# Patient Record
Sex: Male | Born: 1990 | Race: Black or African American | Hispanic: No | Marital: Single | Smoking: Never smoker
Health system: Southern US, Community
[De-identification: ages and names within clinical notes are randomized; demographics above are authoritative.]

## PROBLEM LIST (undated history)

## (undated) DIAGNOSIS — G43909 Migraine, unspecified, not intractable, without status migrainosus: Secondary | ICD-10-CM

## (undated) DIAGNOSIS — Z8719 Personal history of other diseases of the digestive system: Secondary | ICD-10-CM

## (undated) DIAGNOSIS — F419 Anxiety disorder, unspecified: Secondary | ICD-10-CM

## (undated) HISTORY — PX: OTHER SURGICAL HISTORY: SHX169

## (undated) HISTORY — PX: ROTATOR CUFF REPAIR: SHX139

## (undated) HISTORY — PX: FRACTURE SURGERY: SHX138

---

## 2011-01-31 ENCOUNTER — Emergency Department (HOSPITAL_BASED_OUTPATIENT_CLINIC_OR_DEPARTMENT_OTHER)
Admission: EM | Admit: 2011-01-31 | Discharge: 2011-02-01 | Disposition: A | Discharge status: Home / Self Care | Payer: Managed Care, Other (non HMO) | Attending: Emergency Medicine | Admitting: Emergency Medicine

## 2011-01-31 ENCOUNTER — Encounter: Payer: Self-pay | Admitting: *Deleted

## 2011-01-31 DIAGNOSIS — F411 Generalized anxiety disorder: Secondary | ICD-10-CM | POA: Insufficient documentation

## 2011-01-31 DIAGNOSIS — F17200 Nicotine dependence, unspecified, uncomplicated: Secondary | ICD-10-CM | POA: Insufficient documentation

## 2011-01-31 DIAGNOSIS — R0789 Other chest pain: Secondary | ICD-10-CM

## 2011-01-31 DIAGNOSIS — R071 Chest pain on breathing: Secondary | ICD-10-CM | POA: Insufficient documentation

## 2011-01-31 LAB — URINALYSIS, ROUTINE W REFLEX MICROSCOPIC
Hgb urine dipstick: NEGATIVE
Nitrite: NEGATIVE
Specific Gravity, Urine: 1.016 (ref 1.005–1.030)
Urobilinogen, UA: 0.2 mg/dL (ref 0.0–1.0)

## 2011-01-31 NOTE — ED Notes (Addendum)
Per EMS pt went to [facility] to be seen for left upper abd pain but decided to leave before being seen d/t feeling better. pt now c/o left rib area pain that worsens with deep breath and anxiety.

## 2011-01-31 NOTE — ED Notes (Signed)
C/o pain to left rib area which worsened after he became anxious and SOB.

## 2011-02-01 ENCOUNTER — Encounter (HOSPITAL_BASED_OUTPATIENT_CLINIC_OR_DEPARTMENT_OTHER): Payer: Self-pay | Admitting: Emergency Medicine

## 2011-02-01 ENCOUNTER — Emergency Department (INDEPENDENT_AMBULATORY_CARE_PROVIDER_SITE_OTHER): Payer: Managed Care, Other (non HMO)

## 2011-02-01 DIAGNOSIS — R079 Chest pain, unspecified: Secondary | ICD-10-CM

## 2011-02-01 NOTE — ED Provider Notes (Signed)
History     chief complaint chest pain Chief Complaint  Patient presents with  . Flank Pain   HPI Patient developed left lateral chest pain 9:30 PM yesterday has improved with time worse with moving changing position or twisting torso no shortness of breath nausea or sweatiness has had similar pains in the past multiple times with anxiety attacks feels he had anxiety tonight when he could not find his direction while driving or treatment prior to coming here History reviewed. No pertinent past medical history. past medical history panic attacks  Past Surgical History  Procedure Date  . Fracture surgery     History reviewed. No pertinent family history.  History  Substance Use Topics  . Smoking status: Passive Smoker  . Smokeless tobacco: Not on file  . Alcohol Use: No      Review of Systems  Constitutional: Negative.   HENT: Negative.   Respiratory: Negative.  Negative for shortness of breath.   Cardiovascular: Positive for chest pain.  Gastrointestinal: Negative.   Musculoskeletal: Negative.   Skin: Negative.   Neurological: Negative.   Hematological: Negative.   Psychiatric/Behavioral: Negative.        Anxiety    Physical Exam  BP 124/57  Pulse 64  Temp(Src) 98.2 F (36.8 C) (Oral)  Resp 12  SpO2 99%  Physical Exam  Nursing note and vitals reviewed. Constitutional: He appears well-developed and well-nourished.  HENT:  Head: Normocephalic and atraumatic.  Eyes: Conjunctivae are normal. Pupils are equal, round, and reactive to light.  Neck: Neck supple. No tracheal deviation present. No thyromegaly present.  Cardiovascular: Normal rate and regular rhythm.   No murmur heard. Pulmonary/Chest: Effort normal and breath sounds normal. He exhibits no tenderness.       Left lateral chest wall tender. Pain reproduced easily by forcible abduction of left shoulder  Abdominal: Soft. Bowel sounds are normal. He exhibits no distension. There is no tenderness.    Musculoskeletal: Normal range of motion. He exhibits no edema and no tenderness.  Neurological: He is alert. Coordination normal.  Skin: Skin is warm and dry. No rash noted.  Psychiatric: He has a normal mood and affect.    ED Course  Procedures  MDM Chest x-ray no acute disease reviewed by me. Assessment: #1 chest wall pain #2 panic attack      Doug Sou, MD 02/01/11 0981

## 2011-02-07 ENCOUNTER — Encounter (HOSPITAL_BASED_OUTPATIENT_CLINIC_OR_DEPARTMENT_OTHER): Payer: Self-pay

## 2011-02-07 ENCOUNTER — Other Ambulatory Visit: Payer: Self-pay

## 2011-02-07 ENCOUNTER — Emergency Department (HOSPITAL_BASED_OUTPATIENT_CLINIC_OR_DEPARTMENT_OTHER)
Admission: EM | Admit: 2011-02-07 | Discharge: 2011-02-07 | Disposition: A | Discharge status: Home / Self Care | Payer: Managed Care, Other (non HMO) | Attending: Emergency Medicine | Admitting: Emergency Medicine

## 2011-02-07 ENCOUNTER — Emergency Department (INDEPENDENT_AMBULATORY_CARE_PROVIDER_SITE_OTHER): Payer: Managed Care, Other (non HMO)

## 2011-02-07 DIAGNOSIS — R51 Headache: Secondary | ICD-10-CM

## 2011-02-07 DIAGNOSIS — R55 Syncope and collapse: Secondary | ICD-10-CM

## 2011-02-07 DIAGNOSIS — F411 Generalized anxiety disorder: Secondary | ICD-10-CM | POA: Insufficient documentation

## 2011-02-07 HISTORY — DX: Anxiety disorder, unspecified: F41.9

## 2011-02-07 LAB — BASIC METABOLIC PANEL
GFR calc Af Amer: >60 mL/min (ref >60)
GFR calc non Af Amer: >60 mL/min (ref >60)
Glucose, Bld: 93 mg/dL (ref 70–99)
Potassium: 4.3 mEq/L (ref 3.5–5.1)
Sodium: 139 mEq/L (ref 135–145)

## 2011-02-07 MED ORDER — METOCLOPRAMIDE HCL 5 MG/ML IJ SOLN
10.0000 mg | Freq: Once | INTRAMUSCULAR | Status: AC
  Administered 2011-02-07: 10 mg via INTRAMUSCULAR
  Filled 2011-02-07: qty 2

## 2011-02-07 MED ORDER — KETOROLAC TROMETHAMINE 60 MG/2ML IM SOLN
60.0000 mg | Freq: Once | INTRAMUSCULAR | Status: AC
  Administered 2011-02-07: 60 mg via INTRAMUSCULAR
  Filled 2011-02-07: qty 2

## 2011-02-07 NOTE — ED Provider Notes (Signed)
Medical screening examination/treatment/procedure(s) were performed by non-physician practitioner and as supervising physician I was immediately available for consultation/collaboration.   Date: 02/07/2011  Rate: 71  Rhythm: normal sinus rhythm  QRS Axis: normal  Intervals: normal  ST/T Wave abnormalities: early repolarization  Conduction Disutrbances:none  Narrative Interpretation: normal other than probable early repolarization  Old EKG Reviewed: none available    Celene Kras, MD 02/07/11 1444

## 2011-02-07 NOTE — ED Notes (Signed)
Pt reports he was standing at work when he developed a "hot" sensation and recalls waking up on the floor shaking.  States he's had a headache x 4 weeks unrelieved after taking OTC meds.

## 2011-02-07 NOTE — ED Notes (Signed)
Pt recalls being at work, becoming "hot" and then recalls awakening on the floor shaking.  EMS reports no syncopal episode and pt was CAOx4 at time of arrival.  He reports a headache and generalized body aches.  He has been seen recently for similar symptoms and he reports being stressed.

## 2011-02-07 NOTE — ED Notes (Signed)
Pt ambulatory to restroom with his father pt is ambulatory with a steady gait is awake alert and oriented complains of  "soreness all over"

## 2011-02-07 NOTE — ED Notes (Signed)
States he was seen in [place] for headache and had a "normal" CT head scan.

## 2011-03-04 ENCOUNTER — Emergency Department (HOSPITAL_COMMUNITY)
Admission: EM | Admit: 2011-03-04 | Discharge: 2011-03-04 | Disposition: A | Discharge status: Home / Self Care | Payer: Managed Care, Other (non HMO) | Attending: Emergency Medicine | Admitting: Emergency Medicine

## 2011-03-04 DIAGNOSIS — IMO0002 Reserved for concepts with insufficient information to code with codable children: Secondary | ICD-10-CM | POA: Insufficient documentation

## 2011-03-04 DIAGNOSIS — IMO0001 Reserved for inherently not codable concepts without codable children: Secondary | ICD-10-CM | POA: Insufficient documentation

## 2011-03-04 DIAGNOSIS — F411 Generalized anxiety disorder: Secondary | ICD-10-CM | POA: Insufficient documentation

## 2011-03-04 DIAGNOSIS — R071 Chest pain on breathing: Secondary | ICD-10-CM | POA: Insufficient documentation

## 2011-03-04 DIAGNOSIS — M2550 Pain in unspecified joint: Secondary | ICD-10-CM | POA: Insufficient documentation

## 2011-03-04 DIAGNOSIS — R209 Unspecified disturbances of skin sensation: Secondary | ICD-10-CM | POA: Insufficient documentation

## 2011-03-04 DIAGNOSIS — R070 Pain in throat: Secondary | ICD-10-CM | POA: Insufficient documentation

## 2011-03-04 DIAGNOSIS — J3489 Other specified disorders of nose and nasal sinuses: Secondary | ICD-10-CM | POA: Insufficient documentation

## 2012-01-13 ENCOUNTER — Encounter (HOSPITAL_COMMUNITY): Payer: Self-pay | Admitting: Emergency Medicine

## 2012-01-13 ENCOUNTER — Emergency Department (HOSPITAL_COMMUNITY)
Admission: EM | Admit: 2012-01-13 | Discharge: 2012-01-13 | Disposition: A | Discharge status: Home / Self Care | Payer: Managed Care, Other (non HMO) | Source: Home / Self Care | Attending: Emergency Medicine | Admitting: Emergency Medicine

## 2012-01-13 DIAGNOSIS — L02811 Cutaneous abscess of head [any part, except face]: Secondary | ICD-10-CM

## 2012-01-13 DIAGNOSIS — L02818 Cutaneous abscess of other sites: Secondary | ICD-10-CM

## 2012-01-13 DIAGNOSIS — R51 Headache: Secondary | ICD-10-CM

## 2012-01-13 HISTORY — DX: Personal history of other diseases of the digestive system: Z87.19

## 2012-01-13 MED ORDER — NAPROXEN 500 MG PO TABS: 500.0000 mg | ORAL_TABLET | Freq: Two times a day (BID) | ORAL | Status: AC | PRN

## 2012-01-13 MED ORDER — SULFAMETHOXAZOLE-TRIMETHOPRIM 800-160 MG PO TABS: 1.0000 | ORAL_TABLET | Freq: Two times a day (BID) | ORAL | Status: AC

## 2012-01-13 NOTE — Discharge Instructions (Signed)
Follow up with your regular doctor in the next week.  Keep track of your headaches and other symptoms that happen with your headaches and discuss this with your doctor.  Use warm compresses on your scalp infection at least 3 times a day.  Finish all of the antibiotics (trimethoprim-sulfamethoxazole) even if you are feeling better.    Abscess An abscess (boil or furuncle) is an infected area under your skin. This area is filled with yellowish white fluid (pus). HOME CARE   Only take medicine as told by your doctor.   Keep the skin clean around your abscess. Keep clothes that may touch the abscess clean.   Avoid direct skin contact with other people. The infection can spread by skin contact with others.   Practice good hygiene and do not share personal care items.   Do not share athletic equipment, towels, or whirlpools. Shower after every practice or work out session.   See your doctor for a follow-up visit as told.  GET HELP RIGHT AWAY IF:   There is more pain, puffiness (swelling), and redness in the wound site.   There is fluid or bleeding from the wound site.   You have muscle aches, chills, fever, or feel sick.   You or your child has a temperature by mouth above 102 F (38.9 C), not controlled by medicine.   Your baby is older than 3 months with a rectal temperature of 102 F (38.9 C) or higher.  MAKE SURE YOU:   Understand these instructions.   Will watch your condition.   Will get help right away if you are not doing well or get worse.  Document Released: 12/19/2007 Document Revised: 06/21/2011 Document Reviewed: 12/19/2007 [facility] Patient Information 2012 [place], [place].  General Headache, Without Cause A general headache has no specific cause. These headaches are not life-threatening. They will not lead to other types of headaches. HOME CARE   Make and keep follow-up visits with your doctor.   Only take medicine as told by your doctor.   Try to relax, get a  massage, or use your thoughts to control your body (biofeedback).   Apply cold or heat to the head and neck. Apply 3 or 4 times a day or as needed.  Finding out the results of your test Ask when your test results will be ready. Make sure you get your test results. GET HELP RIGHT AWAY IF:   You have problems with medicine.   Your medicine does not help relieve pain.   Your headache changes or becomes worse.   You feel sick to your stomach (nauseous) or throw up (vomit).   You have a temperature by mouth above 102 F (38.9 C), not controlled by medicine.   Your have a stiff neck.   You have vision loss.   You have muscle weakness.   You lose control of your muscles.   You lose balance or have trouble walking.   You feel like you are going to pass out (faint).  MAKE SURE YOU:   Understand these instructions.   Will watch this condition.   Will get help right away if you are not doing well or get worse.  Document Released: 04/10/2008 Document Revised: 06/21/2011 Document Reviewed: 04/10/2008 [facility] Patient Information 2012 [place], [place].

## 2012-01-13 NOTE — ED Provider Notes (Signed)
History     CSN: [id number]  Arrival date & time 01/13/12  1445   First MD Initiated Contact with Patient 01/13/12 1752      Chief Complaint  Patient presents with  . Headache    (Consider location/radiation/quality/duration/timing/severity/associated sxs/prior treatment) HPI Comments: Pt developed headache a month ago.  Sometimes in B forehead, sometimes in back of head.  Sometimes severe, sometimes mild, but never goes away.  Has never had headache like this before.  Has been seen x2 at ER in [facility], told probably had migraines.  Sometimes sounds bother him. A week ago headaches became worse at the top of his head, area very sore, feels "numb and tingly", losing hair in this area.  Had staph infections at base of scalp in 2009 that required I&D and antibiotics.  None since.  No drainage from area at top of head.  Vomited x1 3 days ago, none since but did feel nauseated 2 days ago, none since.  Denies head injury/concussion.  Does play tackle football but is very emphatic that he has never been hit in the head.    Patient is a 21 y.o. male presenting with headaches. The history is provided by the patient.  Headache The primary symptoms include headaches, dizziness, nausea and vomiting. Primary symptoms do not include syncope, loss of consciousness, altered mental status, visual change, focal weakness, loss of sensation, speech change or fever. Episode onset: a month ago. Episode duration: one month. The symptoms are waxing and waning.  The headache is not associated with photophobia, visual change, neck stiffness, weakness or loss of balance.  Dizziness also occurs with nausea and vomiting. Dizziness does not occur with weakness.  Nausea began 2 days ago (had nausea 2 days ago, none since).  Onset: 3 days ago. Vomiting occurred once. The emesis contains stomach contents.  Additional symptoms do not include neck stiffness, weakness, loss of balance or photophobia.    Past Medical  History  Diagnosis Date  . Anxiety   . H/O gastroesophageal reflux (GERD)     Past Surgical History  Procedure Date  . Fracture surgery   . Left shoulder   . Rotator cuff repair     History reviewed. No pertinent family history.  History  Substance Use Topics  . Smoking status: Never Smoker   . Smokeless tobacco: Not on file  . Alcohol Use: No      Review of Systems  Constitutional: Positive for fatigue. Negative for fever and chills.  HENT: Negative for ear pain, congestion, sore throat, rhinorrhea, neck pain, neck stiffness, postnasal drip and sinus pressure.   Eyes: Negative for photophobia and visual disturbance.  Respiratory: Negative for cough and shortness of breath.   Cardiovascular: Negative for chest pain, palpitations and syncope.  Gastrointestinal: Positive for nausea and vomiting. Negative for abdominal pain.  Skin: Positive for rash.       Spot on top of head is site of worst "headache", hair loss in this area.   Neurological: Positive for dizziness, numbness and headaches. Negative for speech change, focal weakness, loss of consciousness, syncope, facial asymmetry, speech difficulty, weakness and loss of balance.  Psychiatric/Behavioral: Negative for altered mental status.    Allergies  Apple juice and Prednisone  Home Medications   Current Outpatient Rx  Name Route Sig Dispense Refill  . OMEPRAZOLE 20 MG PO CPDR Oral Take 20 mg by mouth daily.    Marland Kitchen BISMUTH SUBSALICYLATE 262 MG PO CHEW Oral Chew 524 mg by mouth as  needed. indigestion     . BISMUTH SUBSALICYLATE 262 MG/15ML PO SUSP Oral Take 15 mLs by mouth as needed. indigestion     . CALCIUM CARBONATE ANTACID 500 MG PO CHEW Oral Chew 6 tablets by mouth as needed. indigestion     . IBUPROFEN 200 MG PO TABS Oral Take 400 mg by mouth as needed. headache     . NAPROXEN 500 MG PO TABS Oral Take 1 tablet (500 mg total) by mouth 2 (two) times daily as needed (for pain). 30 tablet 0  .  SULFAMETHOXAZOLE-TRIMETHOPRIM 800-160 MG PO TABS Oral Take 1 tablet by mouth every 12 (twelve) hours. 20 tablet 0    BP 135/72  Pulse 58  Temp 98.7 F (37.1 C) (Oral)  Resp 15  SpO2 100%  Physical Exam  Constitutional: He is oriented to person, place, and time. He appears well-developed and well-nourished. He is cooperative. He does not appear ill. No distress.  HENT:  Right Ear: Tympanic membrane, external ear and ear canal normal.  Left Ear: Tympanic membrane, external ear and ear canal normal.  Nose: No mucosal edema.  Mouth/Throat: Oropharynx is clear and moist.  Eyes: Conjunctivae and EOM are normal. Pupils are equal, round, and reactive to light.  Neck: Normal range of motion. Neck supple. No spinous process tenderness and no muscular tenderness present. No rigidity. No edema present.  Cardiovascular: Normal rate and regular rhythm.   Pulmonary/Chest: Effort normal and breath sounds normal.  Lymphadenopathy:       Head (right side): No submental, no submandibular, no tonsillar, no preauricular, no posterior auricular and no occipital adenopathy present.       Head (left side): No submental, no submandibular, no tonsillar, no preauricular, no posterior auricular and no occipital adenopathy present.    He has no cervical adenopathy.  Neurological: He is alert and oriented to person, place, and time. He has normal strength. He displays a negative Romberg sign. Coordination and gait normal.       CN II-IX intact  Skin: Skin is warm and dry. Rash noted.       1.5cm diameter area of hair loss at top of head, area very tender to palp, slight bogginess to tissue, but no distinct area of fluctuance.  Palpation in this area makes "headache much worse".  Central in this area is a scab/formerly open area. No pus draining noted at this time.  Pseudofolliculitis at base of scalp/hairline at back of neck.      ED Course  Procedures (including critical care time)  Labs Reviewed - No data to  display No results found.   1. Headache   2. Abscess, scalp       MDM  N/V, phonophobia c/w migraine headaches.  Worsening headache in last week or so likely related to skin infection/abscess on top of head.  I don't believe I&D would be helpful with this time. Will tx as abscess and have pt f/u with pcp.  It's quite possible pt has had infection developing in this area for some time that caused month-long headache.  Discussed possibility of migraines with pt, pt to discuss headache sx with pcp.          Cathlyn Parsons, NP 01/13/12 1816

## 2012-01-13 NOTE — ED Notes (Signed)
Multiple complaints.  C/o headaches for one month and seen at [facility]  for headaches and lightheadedness.  Told probably migraines.  C/o bump at top of scalp and now has hair loss.  .feels like something in throat

## 2013-08-30 ENCOUNTER — Encounter (HOSPITAL_COMMUNITY): Payer: Self-pay | Admitting: Emergency Medicine

## 2013-08-30 ENCOUNTER — Emergency Department (HOSPITAL_COMMUNITY)
Admission: EM | Admit: 2013-08-30 | Discharge: 2013-08-30 | Disposition: A | Discharge status: Home / Self Care | Payer: Managed Care, Other (non HMO) | Attending: Emergency Medicine | Admitting: Emergency Medicine

## 2013-08-30 DIAGNOSIS — Z8659 Personal history of other mental and behavioral disorders: Secondary | ICD-10-CM | POA: Insufficient documentation

## 2013-08-30 DIAGNOSIS — K219 Gastro-esophageal reflux disease without esophagitis: Secondary | ICD-10-CM | POA: Insufficient documentation

## 2013-08-30 DIAGNOSIS — R112 Nausea with vomiting, unspecified: Secondary | ICD-10-CM | POA: Insufficient documentation

## 2013-08-30 DIAGNOSIS — R61 Generalized hyperhidrosis: Secondary | ICD-10-CM | POA: Insufficient documentation

## 2013-08-30 DIAGNOSIS — R000 Tachycardia, unspecified: Secondary | ICD-10-CM | POA: Insufficient documentation

## 2013-08-30 DIAGNOSIS — J111 Influenza due to unidentified influenza virus with other respiratory manifestations: Secondary | ICD-10-CM | POA: Insufficient documentation

## 2013-08-30 DIAGNOSIS — Z79899 Other long term (current) drug therapy: Secondary | ICD-10-CM | POA: Insufficient documentation

## 2013-08-30 MED ORDER — ONDANSETRON HCL 4 MG PO TABS: 4.0000 mg | ORAL_TABLET | Freq: Four times a day (QID) | ORAL | Status: DC

## 2013-08-30 MED ORDER — IBUPROFEN 800 MG PO TABS: 800.0000 mg | ORAL_TABLET | Freq: Three times a day (TID) | ORAL | Status: DC

## 2013-08-30 NOTE — ED Notes (Signed)
Pt states hes had flu like symptoms, body aches, and felt like he might have a fever since last night

## 2013-08-30 NOTE — Discharge Instructions (Signed)

## 2013-08-30 NOTE — ED Provider Notes (Signed)
CSN: [id number]     Arrival date & time 08/30/13  1143 History  This chart was scribed for Elpidio Anis, non-physician practitioner, working with Rolland Porter, MD, by Ellin Mayhew, ED Scribe. This patient was seen in room TR07C/TR07C and the patient's care was started at 12:17 PM.  Chief Complaint  Patient presents with  . Fever   The history is provided by the patient. No language interpreter was used.   HPI Comments: Laramie Meissner is a 23 y.o. male with a history of gastritis, who presents to the Emergency Department complaining of new flu-like symptoms, including, myalgias, chills, HA, and fever (temperature taken subjectively at home) with onset one day. Additionally, patient reports having associated nausea with 2 episodes of vomiting. He denies the vomiting to be due to coughing. This is a new problem for the patient and he states his symptoms have been constant and non changing. He reports that he has been able to swallow; however, with pain due to swollen lymph nodes. Patient states he has been eating less than usual in order to avoid vomiting. Patient denies any recent sick contacts. He denies trying to take any OTC medication. He denies taking medication for any other conditions.   Past Medical History  Diagnosis Date  . Anxiety   . H/O gastroesophageal reflux (GERD)    Past Surgical History  Procedure Laterality Date  . Fracture surgery    . Left shoulder    . Rotator cuff repair     History reviewed. No pertinent family history. History  Substance Use Topics  . Smoking status: Never Smoker   . Smokeless tobacco: Not on file  . Alcohol Use: No    Review of Systems  Constitutional: Positive for fever, chills, diaphoresis and appetite change.  HENT: Positive for congestion, sinus pressure and sore throat.   Respiratory: Negative for cough and shortness of breath.   Gastrointestinal: Positive for nausea and vomiting. Negative for diarrhea.  Musculoskeletal: Positive for  myalgias.  Neurological: Negative for weakness.  All other systems reviewed and are negative.   Allergies  Apple juice and Prednisone  Home Medications   Current Outpatient Rx  Name  Route  Sig  Dispense  Refill  . bismuth subsalicylate (PEPTO BISMOL) 262 MG chewable tablet   Oral   Chew 524 mg by mouth as needed. indigestion          . bismuth subsalicylate (PEPTO BISMOL) 262 MG/15ML suspension   Oral   Take 15 mLs by mouth as needed. indigestion          . calcium carbonate (TUMS - DOSED IN MG ELEMENTAL CALCIUM) 500 MG chewable tablet   Oral   Chew 6 tablets by mouth as needed. indigestion          . ibuprofen (ADVIL,MOTRIN) 200 MG tablet   Oral   Take 400 mg by mouth as needed. headache          . omeprazole (PRILOSEC) 20 MG capsule   Oral   Take 20 mg by mouth daily.          Triage Vitals: BP 132/95  Pulse 89  Temp(Src) 99.1 F (37.3 C) (Oral)  Resp 18  SpO2 100%  Physical Exam  Nursing note and vitals reviewed. Constitutional: He is oriented to person, place, and time. He appears well-developed and well-nourished. No distress.  Ill appearing, non-toxic.   HENT:  Head: Normocephalic and atraumatic.  Mouth/Throat: Uvula is midline. Posterior oropharyngeal erythema (mild) present. No oropharyngeal  exudate.  Eyes: EOM are normal.  Neck: Neck supple. No tracheal deviation present.  Cardiovascular: Tachycardia present.   No murmur heard. Borderline tachycardia noted.  Pulmonary/Chest: Effort normal and breath sounds normal. No respiratory distress. He has no wheezes.  Abdominal: There is no tenderness.  Musculoskeletal: Normal range of motion.  Neurological: He is alert and oriented to person, place, and time.  Skin: Skin is warm and dry.  Psychiatric: He has a normal mood and affect. His behavior is normal.    ED Course  Procedures (including critical care time)  DIAGNOSTIC STUDIES: Oxygen Saturation is 100% on room air, normal by my  interpretation.    COORDINATION OF CARE: 12:20 PM-Discussed my suspicion of this being a case of influenza. Explained that the symptoms of influenza this year have been lasting longer than usual. Encouraged patient to drink many fluids, rest and to take tylenol and ibuprofen. Will prescribe medication for nausea. Patient ready to be discharged.Treatment plan discussed with patient and patient agrees.  Labs Review Labs Reviewed - No data to display Imaging Review No results found.  EKG Interpretation   None      MDM   Final diagnoses:  None   1. Influenza  He is non-toxic in appearance. Afebrile constellation of symptoms that are likely influenza in stable patient.   I personally performed the services described in this documentation, which was scribed in my presence. The recorded information has been reviewed and is accurate.     Arnoldo HookerShari A Morrison Paone, PA-C 08/30/13 1407

## 2013-09-03 NOTE — ED Provider Notes (Signed)
Medical screening examination/treatment/procedure(s) were performed by non-physician practitioner and as supervising physician I was immediately available for consultation/collaboration.  EKG Interpretation   None         Renate Danh, MD 09/03/13 0754 

## 2013-09-28 ENCOUNTER — Emergency Department (HOSPITAL_COMMUNITY)
Admission: EM | Admit: 2013-09-28 | Discharge: 2013-09-28 | Discharge status: Against Medical Advice | Payer: Managed Care, Other (non HMO) | Attending: Emergency Medicine | Admitting: Emergency Medicine

## 2013-09-28 ENCOUNTER — Encounter (HOSPITAL_COMMUNITY): Payer: Self-pay | Admitting: Emergency Medicine

## 2013-09-28 DIAGNOSIS — G43909 Migraine, unspecified, not intractable, without status migrainosus: Secondary | ICD-10-CM | POA: Insufficient documentation

## 2013-09-28 HISTORY — DX: Migraine, unspecified, not intractable, without status migrainosus: G43.909

## 2013-09-28 NOTE — ED Notes (Signed)
Pt updated on wait time. Pt reports he does not want to wait. Explained to pt he will have to check back in if he decides to be seen. Pt verbalized understanding.

## 2013-09-28 NOTE — ED Notes (Signed)
Pt reports migraine last night with light sensitivity. Took ibuprofen, pain went away. Today migraine returned, light sensitivity to right eye. States felt like his right eye went black. Is wearing sunglasses, c/o right eye pain and eye pressure. Pupils equal and reactive. Reports now migraine is minor. Using cell phone, in no distress.

## 2013-11-07 ENCOUNTER — Ambulatory Visit: Payer: Managed Care, Other (non HMO)

## 2013-11-07 ENCOUNTER — Ambulatory Visit (INDEPENDENT_AMBULATORY_CARE_PROVIDER_SITE_OTHER): Payer: Managed Care, Other (non HMO) | Admitting: Family Medicine

## 2013-11-07 VITALS — BP 120/64 | HR 78 | Temp 98.1°F | Resp 16 | Ht 72.0 in | Wt 215.0 lb

## 2013-11-07 DIAGNOSIS — M79645 Pain in left finger(s): Secondary | ICD-10-CM

## 2013-11-07 DIAGNOSIS — S6010XA Contusion of unspecified finger with damage to nail, initial encounter: Secondary | ICD-10-CM

## 2013-11-07 DIAGNOSIS — S60029A Contusion of unspecified index finger without damage to nail, initial encounter: Secondary | ICD-10-CM

## 2013-11-07 DIAGNOSIS — S6000XA Contusion of unspecified finger without damage to nail, initial encounter: Secondary | ICD-10-CM

## 2013-11-07 DIAGNOSIS — M79609 Pain in unspecified limb: Secondary | ICD-10-CM

## 2013-11-07 NOTE — Progress Notes (Addendum)
Subjective:    Patient ID: Wayne Davies, male    DOB: 05/04/1991, 23 y.o.   MRN: [id number]  HPI Chief Complaint  Patient presents with  . Nail Problem    1 day, bruise under nail    This chart was scribed for Meredith StaggersJeffrey Taje Littler, MD by Andrew Auaven Small, ED Scribe. This patient was seen in room 5 and the patient's care was started at 4:17 PM.  HPI Comment: Wayne Davies is a 23 y.o. male who presents to the [facility] complaining of a right 2nd digit injury onset 1 day around 4pm. Pt reports while at work for [employer],  lifting a palate, his finger got smushed between a machine and the palate. Pt compares the injury to slamming a finger into car door. Pt reports that he works for [employer] and that this is a new job. Pt reports an hour after the incident he iced finger and took aspirin before bed for pain. He reports that when he woke up this morning he iced his finger again. Pt describes the pain in finger as throbbing, sore and reports edema. Pt denies breaking or injuring this finger in the past but has had similar finger injuries to other fingers.  Pt is left hand dominant.   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  Past Medical History  Diagnosis Date  . Anxiety   . H/O gastroesophageal reflux (GERD)   . Migraine    Allergies  Allergen Reactions  . Apple Juice Other (See Comments)    Acts like a laxative   . Prednisone Other (See Comments)    unknown   Prior to Admission medications   Medication Sig Start Date End Date Taking? Authorizing Provider  ibuprofen (ADVIL,MOTRIN) 200 MG tablet Take 400 mg by mouth as needed. headache     Historical Provider, MD  ibuprofen (ADVIL,MOTRIN) 800 MG tablet Take 1 tablet (800 mg total) by mouth 3 (three) times daily. 08/30/13   Arnoldo HookerShari A Upstill, PA-C   Review of Systems  Musculoskeletal: Positive for myalgias.  Skin: Negative for wound.     . Objective:   Physical Exam  Nursing note and vitals  reviewed. Constitutional: He is oriented to person, place, and time. He appears well-developed and well-nourished. No distress.  HENT:  Head: Normocephalic and atraumatic.  Eyes: EOM are normal.  Neck: Normal range of motion. Neck supple.  Cardiovascular: Normal rate.   Pulmonary/Chest: Effort normal. No respiratory distress.  Musculoskeletal: Normal range of motion.  Left 2nd distal phalanx some soft tissue swelling but skin intact  no swelling or bruising of pad. subungual hematoma 40% at base of nail. Tender at distal phalynx just proximal to base of nail Able to resist flexion and extension at DIP  Neurological: He is alert and oriented to person, place, and time.  Skin: Skin is warm and dry.  Psychiatric: He has a normal mood and affect. His behavior is normal.   [facility] reading (PRIMARY) by  Dr. Neva SeatGreene: L 5th finger: no apparent fx.      Assessment & Plan:    Wayne Davies is a 23 y.o. male Finger pain, left - Plan: DG Finger Index Left  Contusion of index finger without damage to nail  Subungual hematoma of finger of left hand  Contusion without apparent fracture, and subungal hematoma. Fenestrated per procedure note. Splint applied and discussed avoiding pressure to area for few weeks. rtc precautions.   No  orders of the defined types were placed in this encounter.   Patient Instructions  Avoid pressure to this finger and wear brace to area for next 2 weeks as needed. If any redness, swelling, discharge or any worsening of symptoms - return here or emergency room. Return to the clinic or go to the nearest emergency room if any of your symptoms worsen or new symptoms occur. Subungual Hematoma A subungual hematoma is a pocket of blood that collects under the fingernail or toenail. The pressure created by the blood under the nail can cause pain. CAUSES  A subungual hematoma occurs when an injury to the finger or toe causes a blood vessel beneath the nail to break. The  injury can occur from a direct blow such as slamming a finger in a door. It can also occur from a repeated injury such as pressure on the foot in a shoe while running. A subungual hematoma is sometimes called runner's toe or tennis toe. SYMPTOMS   Blue or dark blue skin under the nail.  Pain or throbbing in the injured area. DIAGNOSIS  Your caregiver can determine whether you have a subungual hematoma based on your history and a physical exam. If your caregiver thinks you might have a broken (fractured) bone, X-rays may be taken. TREATMENT  Hematomas usually go away on their own over time. Your caregiver may make a hole in the nail to drain the blood. Draining the blood is painless and usually provides significant relief from pain and throbbing. The nail usually grows back normally after this procedure. In some cases, the nail may need to be removed. This is done if there is a cut under the nail that requires stitches (sutures). HOME CARE INSTRUCTIONS   Put ice on the injured area.  Put ice in a plastic bag.  Place a towel between your skin and the bag.  Leave the ice on for 15-20 minutes, 03-04 times a day for the first 1 to 2 days.  Elevate the injured area to help decrease pain and swelling.  If you were given a bandage, wear it for as long as directed by your caregiver.  If part of your nail falls off, trim the remaining nail gently. This prevents the nail from catching on something and causing further injury.  Only take over-the-counter or prescription medicines for pain, discomfort, or fever as directed by your caregiver. SEEK IMMEDIATE MEDICAL CARE IF:   You have redness or swelling around the nail.  You have yellowish-white fluid (pus) coming from the nail.  Your pain is not controlled with medicine.  You have a fever. MAKE SURE YOU:  Understand these instructions.  Will watch your condition.  Will get help right away if you are not doing well or get worse. Document  Released: 06/29/2000 Document Revised: 09/24/2011 Document Reviewed: 06/20/2011 [facility] Patient Information 2014 [place], [place].     .I personally performed the services described in this documentation, which was scribed in my presence. The recorded information has been reviewed and considered, and addended by me as needed.

## 2013-11-07 NOTE — Progress Notes (Signed)
VCO. Nail cleansed with alcohol wipe.  18 gauge needle used to fenestrate the proximal nail.  Moderate bloody fluid expressed, with reported relief of discomfort.  Bandaid and splint applied.

## 2013-11-07 NOTE — Patient Instructions (Signed)
Avoid pressure to this finger and wear brace to area for next 2 weeks as needed. If any redness, swelling, discharge or any worsening of symptoms - return here or emergency room. Return to the clinic or go to the nearest emergency room if any of your symptoms worsen or new symptoms occur. Subungual Hematoma A subungual hematoma is a pocket of blood that collects under the fingernail or toenail. The pressure created by the blood under the nail can cause pain. CAUSES  A subungual hematoma occurs when an injury to the finger or toe causes a blood vessel beneath the nail to break. The injury can occur from a direct blow such as slamming a finger in a door. It can also occur from a repeated injury such as pressure on the foot in a shoe while running. A subungual hematoma is sometimes called runner's toe or tennis toe. SYMPTOMS   Blue or dark blue skin under the nail.  Pain or throbbing in the injured area. DIAGNOSIS  Your caregiver can determine whether you have a subungual hematoma based on your history and a physical exam. If your caregiver thinks you might have a broken (fractured) bone, X-rays may be taken. TREATMENT  Hematomas usually go away on their own over time. Your caregiver may make a hole in the nail to drain the blood. Draining the blood is painless and usually provides significant relief from pain and throbbing. The nail usually grows back normally after this procedure. In some cases, the nail may need to be removed. This is done if there is a cut under the nail that requires stitches (sutures). HOME CARE INSTRUCTIONS   Put ice on the injured area.  Put ice in a plastic bag.  Place a towel between your skin and the bag.  Leave the ice on for 15-20 minutes, 03-04 times a day for the first 1 to 2 days.  Elevate the injured area to help decrease pain and swelling.  If you were given a bandage, wear it for as long as directed by your caregiver.  If part of your nail falls off, trim  the remaining nail gently. This prevents the nail from catching on something and causing further injury.  Only take over-the-counter or prescription medicines for pain, discomfort, or fever as directed by your caregiver. SEEK IMMEDIATE MEDICAL CARE IF:   You have redness or swelling around the nail.  You have yellowish-white fluid (pus) coming from the nail.  Your pain is not controlled with medicine.  You have a fever. MAKE SURE YOU:  Understand these instructions.  Will watch your condition.  Will get help right away if you are not doing well or get worse. Document Released: 06/29/2000 Document Revised: 09/24/2011 Document Reviewed: 06/20/2011 [facility] Patient Information 2014 [place]ExitCare, [place].

## 2014-03-01 ENCOUNTER — Encounter (HOSPITAL_BASED_OUTPATIENT_CLINIC_OR_DEPARTMENT_OTHER): Payer: Self-pay | Admitting: Emergency Medicine

## 2014-03-01 ENCOUNTER — Emergency Department (HOSPITAL_BASED_OUTPATIENT_CLINIC_OR_DEPARTMENT_OTHER): Payer: Managed Care, Other (non HMO)

## 2014-03-01 ENCOUNTER — Emergency Department (HOSPITAL_BASED_OUTPATIENT_CLINIC_OR_DEPARTMENT_OTHER)
Admission: EM | Admit: 2014-03-01 | Discharge: 2014-03-01 | Disposition: A | Discharge status: Home / Self Care | Payer: Managed Care, Other (non HMO) | Attending: Emergency Medicine | Admitting: Emergency Medicine

## 2014-03-01 DIAGNOSIS — X500XXA Overexertion from strenuous movement or load, initial encounter: Secondary | ICD-10-CM | POA: Insufficient documentation

## 2014-03-01 DIAGNOSIS — Y92838 Other recreation area as the place of occurrence of the external cause: Secondary | ICD-10-CM

## 2014-03-01 DIAGNOSIS — Y9361 Activity, american tackle football: Secondary | ICD-10-CM | POA: Insufficient documentation

## 2014-03-01 DIAGNOSIS — S63619A Unspecified sprain of unspecified finger, initial encounter: Secondary | ICD-10-CM

## 2014-03-01 DIAGNOSIS — Z8679 Personal history of other diseases of the circulatory system: Secondary | ICD-10-CM | POA: Diagnosis not present

## 2014-03-01 DIAGNOSIS — S6980XA Other specified injuries of unspecified wrist, hand and finger(s), initial encounter: Secondary | ICD-10-CM | POA: Diagnosis present

## 2014-03-01 DIAGNOSIS — S6390XA Sprain of unspecified part of unspecified wrist and hand, initial encounter: Secondary | ICD-10-CM | POA: Diagnosis not present

## 2014-03-01 DIAGNOSIS — Y9239 Other specified sports and athletic area as the place of occurrence of the external cause: Secondary | ICD-10-CM | POA: Diagnosis not present

## 2014-03-01 DIAGNOSIS — Z8719 Personal history of other diseases of the digestive system: Secondary | ICD-10-CM | POA: Diagnosis not present

## 2014-03-01 DIAGNOSIS — S6990XA Unspecified injury of unspecified wrist, hand and finger(s), initial encounter: Secondary | ICD-10-CM | POA: Diagnosis present

## 2014-03-01 DIAGNOSIS — Z8659 Personal history of other mental and behavioral disorders: Secondary | ICD-10-CM | POA: Diagnosis not present

## 2014-03-01 MED ORDER — IBUPROFEN 800 MG PO TABS: 800.0000 mg | ORAL_TABLET | Freq: Three times a day (TID) | ORAL | Status: DC

## 2014-03-01 NOTE — ED Notes (Signed)
Pt refused gown states he is just here for his finger

## 2014-03-01 NOTE — Discharge Instructions (Signed)
Cryotherapy °Cryotherapy is when you put ice on your injury. Ice helps lessen pain and puffiness (swelling) after an injury. Ice works the best when you start using it in the first 24 to 48 hours after an injury. °HOME CARE °· Put a dry or damp towel between the ice pack and your skin. °· You may press gently on the ice pack. °· Leave the ice on for no more than 10 to 20 minutes at a time. °· Check your skin after 5 minutes to make sure your skin is okay. °· Rest at least 20 minutes between ice pack uses. °· Stop using ice when your skin loses feeling (numbness). °· Do not use ice on someone who cannot tell you when it hurts. This includes small children and people with memory problems (dementia). °GET HELP RIGHT AWAY IF: °· You have white spots on your skin. °· Your skin turns blue or pale. °· Your skin feels waxy or hard. °· Your puffiness gets worse. °MAKE SURE YOU:  °· Understand these instructions. °· Will watch your condition. °· Will get help right away if you are not doing well or get worse. °Document Released: 12/19/2007 Document Revised: 09/24/2011 Document Reviewed: 02/22/2011 °ExitCare® Patient Information ©2015 [employer], LLC. This information is not intended to replace advice given to you by your health care provider. Make sure you discuss any questions you have with your health care provider. ° °Finger Sprain °A finger sprain is a tear in one of the strong, fibrous tissues that connect the bones (ligaments) in your finger. The severity of the sprain depends on how much of the ligament is torn. The tear can be either partial or complete. °CAUSES  °Often, sprains are a result of a fall or accident. If you extend your hands to catch an object or to protect yourself, the force of the impact causes the fibers of your ligament to stretch too much. This excess tension causes the fibers of your ligament to tear. °SYMPTOMS  °You may have some loss of motion in your finger. Other symptoms  include: °· Bruising. °· Tenderness. °· Swelling. °DIAGNOSIS  °In order to diagnose finger sprain, your caregiver will physically examine your finger or thumb to determine how torn the ligament is. Your caregiver may also suggest an X-ray exam of your finger to make sure no bones are broken. °TREATMENT  °If your ligament is only partially torn, treatment usually involves keeping the finger in a fixed position (immobilization) for a short period. To do this, your caregiver will apply a bandage, cast, or splint to keep your finger from moving until it heals. For a partially torn ligament, the healing process usually takes 2 to 3 weeks. °If your ligament is completely torn, you may need surgery to reconnect the ligament to the bone. After surgery a cast or splint will be applied and will need to stay on your finger or thumb for 4 to 6 weeks while your ligament heals. °HOME CARE INSTRUCTIONS °· Keep your injured finger elevated, when possible, to decrease swelling. °· To ease pain and swelling, apply ice to your joint twice a day, for 2 to 3 days: °¨ Put ice in a plastic bag. °¨ Place a towel between your skin and the bag. °¨ Leave the ice on for 15 minutes. °· Only take over-the-counter or prescription medicine for pain as directed by your caregiver. °· Do not wear rings on your injured finger. °· Do not leave your finger unprotected until pain and stiffness go   away (usually 3 to 4 weeks). °· Do not allow your cast or splint to get wet. Cover your cast or splint with a plastic bag when you shower or bathe. Do not swim. °· Your caregiver may suggest special exercises for you to do during your recovery to prevent or limit permanent stiffness. °SEEK IMMEDIATE MEDICAL CARE IF: °· Your cast or splint becomes damaged. °· Your pain becomes worse rather than better. °MAKE SURE YOU: °· Understand these instructions. °· Will watch your condition. °· Will get help right away if you are not doing well or get worse. °Document  Released: 08/09/2004 Document Revised: 09/24/2011 Document Reviewed: 03/05/2011 °ExitCare® Patient Information ©2015 Exit[employer], LLC. This information is not intended to replace advice given to you by your health care provider. Make sure you discuss any questions you have with your health care provider. ° °

## 2014-03-01 NOTE — ED Notes (Signed)
Pt c/o right hand injury while playing football x 3 days ago

## 2014-03-01 NOTE — ED Provider Notes (Addendum)
patient injured his right hand at index finger MCP joint on 02/27/2014. He reports he hyperextended his index finger. He felt as if his finger were out of joint. He pulled on it and put it back into place. Presently he complains of pain at the MCP joint of the index finger upon active motion. Eye exam patient in no distress right index finger no swelling with full range of motion, with pain. Good capillary refill Xray viewed by me. Results for orders placed during the hospital encounter of 02/07/11  BASIC METABOLIC PANEL      Result Value Ref Range   Sodium 139  135 - 145 mEq/L   Potassium 4.3  3.5 - 5.1 mEq/L   Chloride 101  96 - 112 mEq/L   CO2 30  19 - 32 mEq/L   Glucose, Bld 93  70 - 99 mg/dL   BUN 10  6 - 23 mg/dL   Creatinine, Ser 9.521.10  0.50 - 1.35 mg/dL   Calcium 84.110.4  8.4 - 32.410.5 mg/dL   GFR calc non Af Amer >60  >60 mL/min   GFR calc Af Amer >60  >60 mL/min   Dg Hand Complete Right  03/01/2014   CLINICAL DATA:  Hand injury.  EXAM: RIGHT HAND - COMPLETE 3+ VIEW  COMPARISON:  None.  FINDINGS: There is no fracture or dislocation or other osseous abnormality. There is soft tissue swelling over the dorsum of the hand.  IMPRESSION: No osseous abnormality.   Electronically Signed   By: Geanie CooleyJim  Maxwell M.D.   On: 03/01/2014 16:42  Splint placed on patient which is comfortable for him Doug SouSam Kellin Bartling, MD 03/01/14 1707  Doug SouSam Yaqueline Gutter, MD 03/01/14 [id number]  Doug SouSam Kamyiah Colantonio, MD 03/01/14 1714

## 2014-03-01 NOTE — ED Provider Notes (Signed)
CSN: [id number]     Arrival date & time 03/01/14  1558 History   First MD Initiated Contact with Patient 03/01/14 1621     Chief Complaint  Patient presents with  . Hand Injury     (Consider location/radiation/quality/duration/timing/severity/associated sxs/prior Treatment) Patient is a 23 y.o. male presenting with hand injury. The history is provided by the patient. No language interpreter was used.  Hand Injury Location:  Hand Hand location:  R hand Handedness:  Right-handed Dislocation: no   Foreign body present:  No foreign bodies Associated symptoms: no fever   Associated symptoms comment:  Injury to right hand while playing football 2 days ago where he feels he hyperextended the index finger causing a dislocation which he reduced at the time of the injury. He has continued pain and swelling and presents for evaluation.   Past Medical History  Diagnosis Date  . Anxiety   . H/O gastroesophageal reflux (GERD)   . Migraine    Past Surgical History  Procedure Laterality Date  . Fracture surgery    . Left shoulder    . Rotator cuff repair     History reviewed. No pertinent family history. History  Substance Use Topics  . Smoking status: Never Smoker   . Smokeless tobacco: Not on file  . Alcohol Use: No    Review of Systems  Constitutional: Negative for fever and chills.  Musculoskeletal: Negative.        See HPI  Skin: Negative.   Neurological: Negative.  Negative for numbness.      Allergies  Apple juice and Prednisone  Home Medications   Prior to Admission medications   Medication Sig Start Date End Date Taking? Authorizing Provider  ibuprofen (ADVIL,MOTRIN) 200 MG tablet Take 400 mg by mouth as needed. headache     Historical Provider, MD  ibuprofen (ADVIL,MOTRIN) 800 MG tablet Take 1 tablet (800 mg total) by mouth 3 (three) times daily. 08/30/13   Dub Maclellan A Amous Crewe, PA-C   BP 127/65  Pulse 86  Temp(Src) 98.5 F (36.9 C) (Oral)  Resp 18  Ht 6\' 1"   (1.854 m)  Wt 220 lb (99.791 kg)  BMI 29.03 kg/m2  SpO2 100% Physical Exam  Constitutional: He is oriented to person, place, and time. He appears well-developed and well-nourished.  Neck: Normal range of motion.  Pulmonary/Chest: Effort normal.  Musculoskeletal: Normal range of motion.  Right hand has mild dorsal swelling associated with the 2nd MCP joint. FROM all joints of the 2nd finger on flexion and extension. No bony deformities or discoloration.  Neurological: He is alert and oriented to person, place, and time.  Skin: Skin is warm and dry.  No wound.  Psychiatric: He has a normal mood and affect.    ED Course  Procedures (including critical care time) Labs Review Labs Reviewed - No data to display  Imaging Review Dg Hand Complete Right  03/01/2014   CLINICAL DATA:  Hand injury.  EXAM: RIGHT HAND - COMPLETE 3+ VIEW  COMPARISON:  None.  FINDINGS: There is no fracture or dislocation or other osseous abnormality. There is soft tissue swelling over the dorsum of the hand.  IMPRESSION: No osseous abnormality.   Electronically Signed   By: Geanie CooleyJim  Maxwell M.D.   On: 03/01/2014 16:42     EKG Interpretation None      MDM   Final diagnoses:  None    1. Finger sprain  No obvious tendon deficits of right index finger. Suspect, based on history, that  finger may have been dislocated and reduced by the patient at the time of the injury. Finger splinted for comfort. Hand ortho referral provided.    Arnoldo Hooker, PA-C 03/01/14 1709

## 2014-03-01 NOTE — ED Provider Notes (Signed)
Medical screening examination/treatment/procedure(s) were performed by non-physician practitioner and as supervising physician I was immediately available for consultation/collaboration.   EKG Interpretation None       Shaley Leavens, MD 03/01/14 2323 

## 2014-12-27 ENCOUNTER — Encounter (HOSPITAL_COMMUNITY): Payer: Self-pay | Admitting: *Deleted

## 2014-12-27 ENCOUNTER — Emergency Department (HOSPITAL_COMMUNITY)
Admission: EM | Admit: 2014-12-27 | Discharge: 2014-12-27 | Disposition: A | Discharge status: Home / Self Care | Payer: Managed Care, Other (non HMO) | Attending: Emergency Medicine | Admitting: Emergency Medicine

## 2014-12-27 DIAGNOSIS — Y9289 Other specified places as the place of occurrence of the external cause: Secondary | ICD-10-CM | POA: Diagnosis not present

## 2014-12-27 DIAGNOSIS — T781XXA Other adverse food reactions, not elsewhere classified, initial encounter: Secondary | ICD-10-CM | POA: Insufficient documentation

## 2014-12-27 DIAGNOSIS — Y9389 Activity, other specified: Secondary | ICD-10-CM | POA: Insufficient documentation

## 2014-12-27 DIAGNOSIS — Z8679 Personal history of other diseases of the circulatory system: Secondary | ICD-10-CM | POA: Insufficient documentation

## 2014-12-27 DIAGNOSIS — X58XXXA Exposure to other specified factors, initial encounter: Secondary | ICD-10-CM | POA: Insufficient documentation

## 2014-12-27 DIAGNOSIS — Z91018 Allergy to other foods: Secondary | ICD-10-CM | POA: Diagnosis not present

## 2014-12-27 DIAGNOSIS — Y998 Other external cause status: Secondary | ICD-10-CM | POA: Diagnosis not present

## 2014-12-27 DIAGNOSIS — T7840XA Allergy, unspecified, initial encounter: Secondary | ICD-10-CM

## 2014-12-27 DIAGNOSIS — J392 Other diseases of pharynx: Secondary | ICD-10-CM | POA: Diagnosis present

## 2014-12-27 DIAGNOSIS — R079 Chest pain, unspecified: Secondary | ICD-10-CM | POA: Diagnosis not present

## 2014-12-27 DIAGNOSIS — Z8659 Personal history of other mental and behavioral disorders: Secondary | ICD-10-CM | POA: Insufficient documentation

## 2014-12-27 DIAGNOSIS — Z8719 Personal history of other diseases of the digestive system: Secondary | ICD-10-CM | POA: Diagnosis not present

## 2014-12-27 MED ORDER — DIPHENHYDRAMINE HCL 25 MG PO CAPS
50.0000 mg | ORAL_CAPSULE | Freq: Once | ORAL | Status: AC
  Administered 2014-12-27: 50 mg via ORAL

## 2014-12-27 MED ORDER — FAMOTIDINE 20 MG PO TABS
20.0000 mg | ORAL_TABLET | Freq: Once | ORAL | Status: AC
  Administered 2014-12-27: 20 mg via ORAL

## 2014-12-27 NOTE — ED Provider Notes (Signed)
CSN: [id number]     Arrival date & time 12/27/14  0216 History   First MD Initiated Contact with Patient 12/27/14 [phone]     Chief Complaint  Patient presents with  . Allergic Reaction     (Consider location/radiation/quality/duration/timing/severity/associated sxs/prior Treatment) HPI Comments: Patient reports drinking a drink that may have had apples in it. Following the drink, has reports throat itching and burning in his chest. Symptoms remained constant since the onset. No SOB or facial swelling. Patient did not try anything for symptoms.   Patient is a 24 y.o. male presenting with allergic reaction. The history is provided by the patient. No language interpreter was used.  Allergic Reaction Presenting symptoms: no difficulty breathing and no rash   Presenting symptoms comment:  Throat itching Severity:  Moderate Prior allergic episodes:  Food/nut allergies Context: no chemicals, no cosmetics, no dairy/milk products, no food allergies, no grass, no insect bite/sting, no medications and no poison ivy   Context comment:  Apples Relieved by:  Nothing Worsened by:  Nothing tried Ineffective treatments:  None tried   Past Medical History  Diagnosis Date  . Anxiety   . H/O gastroesophageal reflux (GERD)   . Migraine    Past Surgical History  Procedure Laterality Date  . Fracture surgery    . Left shoulder    . Rotator cuff repair     No family history on file. History  Substance Use Topics  . Smoking status: Never Smoker   . Smokeless tobacco: Not on file  . Alcohol Use: No    Review of Systems  Cardiovascular: Positive for chest pain.  Skin: Negative for rash.  All other systems reviewed and are negative.     Allergies  Apple juice and Prednisone  Home Medications   Prior to Admission medications   Medication Sig Start Date End Date Taking? Authorizing Provider  ibuprofen (ADVIL,MOTRIN) 200 MG tablet Take 400 mg by mouth every 6 (six) hours as needed for  moderate pain.    Yes Historical Provider, MD   BP 119/57 mmHg  Pulse 63  Temp(Src) 97.8 F (36.6 C) (Oral)  Resp 13  Ht 6\' 2"  (1.88 m)  Wt 229 lb (103.874 kg)  BMI 29.39 kg/m2  SpO2 99% Physical Exam  Constitutional: He appears well-developed and well-nourished. No distress.  HENT:  Head: Normocephalic and atraumatic.  Mouth/Throat: Oropharynx is clear and moist. No oropharyngeal exudate.  Eyes: Conjunctivae and EOM are normal.  Neck: Normal range of motion.  Cardiovascular: Normal rate and regular rhythm.  Exam reveals no gallop and no friction rub.   No murmur heard. Pulmonary/Chest: Effort normal and breath sounds normal. He has no wheezes. He has no rales. He exhibits no tenderness.  Abdominal: Soft. There is no tenderness. There is no rebound.  Musculoskeletal: Normal range of motion.  Neurological: He is alert. Coordination normal.  Speech is goal-oriented. Moves limbs without ataxia.   Skin: Skin is warm and dry.  Psychiatric: He has a normal mood and affect. His behavior is normal.  Nursing note and vitals reviewed.   ED Course  Procedures (including critical care time) Labs Review Labs Reviewed - No data to display  Imaging Review No results found.   EKG Interpretation   Date/Time:  Monday December 27 2014 02:27:02 EDT Ventricular Rate:  70 PR Interval:  138 QRS Duration: 90 QT Interval:  394 QTC Calculation: 425 R Axis:   77 Text Interpretation:  Normal sinus rhythm ST elevation, consider early  repolarization  Borderline ECG No significant change since last tracing  Confirmed by Erroll Luna [phone]) on 12/27/2014 6:08:19 AM      MDM   Final diagnoses:  Allergic reaction, initial encounter    6:23 AM Patient feeling better after pepcid and benadryl. Patient will be observed for another hour. Vitals stable and patient afebrile.   8:03 AM Patient feeling better and will be discharged.    Emilia Beck, PA-C 12/27/14 6045  Tomasita Crumble, MD 12/27/14 1517

## 2014-12-27 NOTE — ED Notes (Signed)
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  He woke up 0200am  Felt his throat was scrathy and he has a burning in his chest

## 2016-03-26 ENCOUNTER — Ambulatory Visit (INDEPENDENT_AMBULATORY_CARE_PROVIDER_SITE_OTHER): Payer: Managed Care, Other (non HMO) | Admitting: Physician Assistant

## 2016-03-26 DIAGNOSIS — Z23 Encounter for immunization: Secondary | ICD-10-CM | POA: Diagnosis not present

## 2016-04-03 NOTE — Progress Notes (Signed)
Needs TDap for school

## 2016-06-22 ENCOUNTER — Emergency Department (HOSPITAL_COMMUNITY)
Admission: EM | Admit: 2016-06-22 | Discharge: 2016-06-22 | Disposition: A | Discharge status: Home / Self Care | Payer: Managed Care, Other (non HMO) | Attending: Emergency Medicine | Admitting: Emergency Medicine

## 2016-06-22 ENCOUNTER — Emergency Department (HOSPITAL_COMMUNITY): Payer: Managed Care, Other (non HMO)

## 2016-06-22 ENCOUNTER — Encounter (HOSPITAL_COMMUNITY): Payer: Self-pay

## 2016-06-22 DIAGNOSIS — Y939 Activity, unspecified: Secondary | ICD-10-CM | POA: Insufficient documentation

## 2016-06-22 DIAGNOSIS — X501XXA Overexertion from prolonged static or awkward postures, initial encounter: Secondary | ICD-10-CM | POA: Diagnosis not present

## 2016-06-22 DIAGNOSIS — M25532 Pain in left wrist: Secondary | ICD-10-CM | POA: Diagnosis present

## 2016-06-22 DIAGNOSIS — Y999 Unspecified external cause status: Secondary | ICD-10-CM | POA: Diagnosis not present

## 2016-06-22 DIAGNOSIS — Y929 Unspecified place or not applicable: Secondary | ICD-10-CM | POA: Diagnosis not present

## 2016-06-22 NOTE — ED Provider Notes (Signed)
[facility]-EMERGENCY DEPT Provider Note   CSN: [id number] Arrival date & time: 06/22/16  1734  By signing my name below, I, Wayne Davies, attest that this documentation has been prepared under the direction and in the presence of  Buel Ream, PA-C. Electronically Signed: Doreatha Davies, ED Scribe. 06/22/16. 7:27 PM.    History   Chief Complaint Chief Complaint  Patient presents with  . Wrist Pain    HPI Wayne Davies is a 25 y.o. male who presents to the Emergency Department complaining of moderate, sharp left wrist pain onset today. Pt intitially fractured his left wrist 06/02/16 while playing football during a tackle. He is followed by [facility]. He states that today he felt a "pop" while putting on his jacket followed by sharp pain, tingling in his fingers and nausea. Per pt, his current symptoms feel similar to before his fracture was reduced. He states his nausea has now resolved. Pt denies numbness, abdominal pain, vomiting, additional pain or complaints.   The history is provided by the patient. No language interpreter was used.    Past Medical History:  Diagnosis Date  . Anxiety   . H/O gastroesophageal reflux (GERD)   . Migraine     There are no active problems to display for this patient.   Past Surgical History:  Procedure Laterality Date  . FRACTURE SURGERY    . left shoulder    . ROTATOR CUFF REPAIR      OB History    No data available       Home Medications    Prior to Admission medications   Medication Sig Start Date End Date Taking? Authorizing Provider  ibuprofen (ADVIL,MOTRIN) 200 MG tablet Take 400 mg by mouth every 6 (six) hours as needed for moderate pain.     Historical Provider, MD    Family History No family history on file.  Social History Social History  Substance Use Topics  . Smoking status: Never Smoker  . Smokeless tobacco: Never Used  . Alcohol use No     Allergies   Apple juice and Prednisone   Review of  Systems Review of Systems  Constitutional: Negative for chills and fever.  HENT: Negative for facial swelling and sore throat.   Respiratory: Negative for shortness of breath.   Cardiovascular: Negative for chest pain.  Gastrointestinal: Negative for abdominal pain, nausea and vomiting.  Genitourinary: Negative for dysuria.  Musculoskeletal: Positive for arthralgias. Negative for back pain.  Skin: Negative for rash and wound.  Neurological: Negative for numbness and headaches.       +tingling  Psychiatric/Behavioral: The patient is not nervous/anxious.      Physical Exam Updated Vital Signs BP 139/82 (BP Location: Right Arm)   Pulse 92   Temp 98.3 F (36.8 C) (Oral)   Resp 18   SpO2 100%   Physical Exam  Constitutional: He appears well-developed and well-nourished. No distress.  HENT:  Head: Normocephalic and atraumatic.  Mouth/Throat: Oropharynx is clear and moist. No oropharyngeal exudate.  Eyes: Conjunctivae are normal. Pupils are equal, round, and reactive to light. Right eye exhibits no discharge. Left eye exhibits no discharge. No scleral icterus.  Neck: Normal range of motion. Neck supple. No thyromegaly present.  Cardiovascular: Normal rate, regular rhythm, normal heart sounds and intact distal pulses.  Exam reveals no gallop and no friction rub.   No murmur heard. Pulmonary/Chest: Effort normal and breath sounds normal. No stridor. No respiratory distress. He has no wheezes. He has no rales.  Abdominal: Soft. Bowel sounds are normal. He exhibits no distension. There is no tenderness. There is no rebound and no guarding.  Musculoskeletal: He exhibits no edema.  Patient in cast, so exam limited Pain with movement and palpation of the left fifth and fourth digits, sensation intact and cap refill <2sec  Lymphadenopathy:    He has no cervical adenopathy.  Neurological: He is alert. Coordination normal.  Skin: Skin is warm and dry. No rash noted. He is not diaphoretic. No  pallor.  Psychiatric: He has a normal mood and affect.  Nursing note and vitals reviewed.    ED Treatments / Results   DIAGNOSTIC STUDIES: Oxygen Saturation is 100% on RA, normal by my interpretation.    COORDINATION OF CARE: 7:20 PM Discussed treatment plan with pt at bedside which includes XR and pt agreed to plan.    Labs (all labs ordered are listed, but only abnormal results are displayed) Labs Reviewed - No data to display  EKG  EKG Interpretation None       Radiology Dg Forearm Left  Result Date: 06/22/2016 CLINICAL DATA:  Previous fracture. New pain distal to elbow after moving arm today. Initial encounter. EXAM: LEFT FOREARM - 2 VIEW COMPARISON:  Radiography from earlier today FINDINGS: Stable alignment of distal radius fracture with expected inclination. Normal radiocarpal alignment. No findings at the newly symptomatic proximal forearm. No elbow joint effusion. IMPRESSION: 1. No new abnormality. 2. Stable alignment of casted distal radius fracture. Electronically Signed   By: Marnee SpringJonathon  Watts M.D.   On: 06/22/2016 21:11   Dg Wrist Complete Left  Result Date: 06/22/2016 CLINICAL DATA:  Fracture in wrist November 18. Pop felt in the wrist today. EXAM: LEFT WRIST - COMPLETE 3+ VIEW COMPARISON:  None. FINDINGS: Comminuted, angulated, and displaced fracture through the distal radius. IMPRESSION: Comminuted angulated displaced fracture through the distal radius. There are no comparisons to assess for interval change. Recommend comparison outside priors. Electronically Signed   By: Gerome Samavid  Williams III M.D   On: 06/22/2016 19:24    Procedures Procedures (including critical care time)  Medications Ordered in ED Medications - No data to display   Initial Impression / Assessment and Plan / ED Course  I have reviewed the triage vital signs and the nursing notes.  Pertinent labs & imaging results that were available during my care of the patient were reviewed by me and  considered in my medical decision making (see chart for details).  Clinical Course     Patient Left forearm X-Ray shows no new abnormality, stable alignment of casted distal radius fracture; a left wrist x-ray shows comminuted angulated displaced fracture through the distal radius, however there are no comparisons to assess for interval change. Over the course of the pts ED stay, pt just has intermittent sharp pain over the area of his fracture, but no pain elsewhere. Pt advised to follow up with Dr. Eulah PontMurphy on Monday. Conservative therapy recommended and discussed. Patient has pain medication at home. Patient will be discharged home & is agreeable with above plan. Returns precautions discussed. Pt appears safe for discharge. I discussed patient case with Dr. Karma GanjaLinker who guided the patient's management and agrees with plan.   Final Clinical Impressions(s) / ED Diagnoses   Final diagnoses:  Left wrist pain    New Prescriptions New Prescriptions   No medications on file    I personally performed the services described in this documentation, which was scribed in my presence. The recorded information has been reviewed  and is accurate.    Emi Holeslexandra M Eleonora Peeler, PA-C 06/23/16 0159    Jerelyn ScottMartha Linker, MD 06/23/16 [phone]

## 2016-06-22 NOTE — ED Triage Notes (Addendum)
Pt states he thinks his left wrist is broken again. He has a known broken left wrist and has cast on it. He fractured it on Nov 18. Tonight when he felt a pop when he was putting on his jacket.

## 2016-06-22 NOTE — ED Notes (Signed)
Re paged Ortho to Law @[phone] 

## 2016-06-22 NOTE — ED Notes (Signed)
PA at bedside.

## 2016-06-22 NOTE — Discharge Instructions (Signed)
Take your medicines at home as prescribed for Dr. Eulah PontMurphy. Please follow-up with Dr. Greig RightMurphy's office on Monday. Please return to emergency department if you develop any new or worsening symptoms.

## 2016-06-22 NOTE — ED Notes (Signed)
No ice placed d/t cast

## 2017-09-13 ENCOUNTER — Emergency Department (HOSPITAL_COMMUNITY): Payer: Managed Care, Other (non HMO)

## 2017-09-13 ENCOUNTER — Emergency Department (HOSPITAL_COMMUNITY)
Admission: EM | Admit: 2017-09-13 | Discharge: 2017-09-13 | Disposition: A | Discharge status: Home / Self Care | Payer: Managed Care, Other (non HMO) | Attending: Emergency Medicine | Admitting: Emergency Medicine

## 2017-09-13 ENCOUNTER — Encounter (HOSPITAL_COMMUNITY): Payer: Self-pay

## 2017-09-13 ENCOUNTER — Other Ambulatory Visit: Payer: Self-pay

## 2017-09-13 DIAGNOSIS — M25532 Pain in left wrist: Secondary | ICD-10-CM

## 2017-09-13 DIAGNOSIS — G5602 Carpal tunnel syndrome, left upper limb: Secondary | ICD-10-CM | POA: Insufficient documentation

## 2017-09-13 MED ORDER — NAPROXEN 500 MG PO TABS: 500.0000 mg | ORAL_TABLET | Freq: Two times a day (BID) | ORAL | 0 refills | Status: DC
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
## 2017-09-13 NOTE — ED Notes (Signed)
Patient Alert and oriented to baseline. Stable and ambulatory to baseline. Patient verbalized understanding of the discharge instructions.  Patient belongings were taken by the patient.   

## 2017-09-13 NOTE — Discharge Instructions (Signed)
Please read attached information regarding your condition. Wear wrist brace as directed. Take naproxen twice daily as needed to help with pain and swelling. Follow-up with orthopedic specialist for further evaluation. Return to ED for worsening symptoms, injuries to the hand, numbness in hands, red hot/tender joint with fever.
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
## 2019-12-22 ENCOUNTER — Other Ambulatory Visit: Payer: Self-pay

## 2019-12-22 ENCOUNTER — Emergency Department (HOSPITAL_BASED_OUTPATIENT_CLINIC_OR_DEPARTMENT_OTHER)
Admission: EM | Admit: 2019-12-22 | Discharge: 2019-12-22 | Disposition: A | Discharge status: Home / Self Care | Payer: Self-pay | Attending: Emergency Medicine | Admitting: Emergency Medicine

## 2019-12-22 ENCOUNTER — Encounter (HOSPITAL_BASED_OUTPATIENT_CLINIC_OR_DEPARTMENT_OTHER): Payer: Self-pay

## 2019-12-22 ENCOUNTER — Emergency Department (HOSPITAL_BASED_OUTPATIENT_CLINIC_OR_DEPARTMENT_OTHER): Payer: Self-pay

## 2019-12-22 DIAGNOSIS — Y999 Unspecified external cause status: Secondary | ICD-10-CM | POA: Insufficient documentation

## 2019-12-22 DIAGNOSIS — W109XXA Fall (on) (from) unspecified stairs and steps, initial encounter: Secondary | ICD-10-CM | POA: Insufficient documentation

## 2019-12-22 DIAGNOSIS — Y9301 Activity, walking, marching and hiking: Secondary | ICD-10-CM | POA: Insufficient documentation

## 2019-12-22 DIAGNOSIS — W19XXXA Unspecified fall, initial encounter: Secondary | ICD-10-CM

## 2019-12-22 DIAGNOSIS — Y929 Unspecified place or not applicable: Secondary | ICD-10-CM | POA: Insufficient documentation

## 2019-12-22 DIAGNOSIS — S40012A Contusion of left shoulder, initial encounter: Secondary | ICD-10-CM

## 2019-12-22 DIAGNOSIS — Z888 Allergy status to other drugs, medicaments and biological substances status: Secondary | ICD-10-CM | POA: Insufficient documentation

## 2019-12-22 DIAGNOSIS — Z91018 Allergy to other foods: Secondary | ICD-10-CM | POA: Insufficient documentation

## 2019-12-22 MED ORDER — NAPROXEN 500 MG PO TABS: 500.0000 mg | ORAL_TABLET | Freq: Two times a day (BID) | ORAL | 0 refills | Status: AC

## 2019-12-22 MED ORDER — TRAMADOL HCL 50 MG PO TABS: 50.0000 mg | ORAL_TABLET | Freq: Four times a day (QID) | ORAL | 0 refills | Status: AC | PRN

## 2019-12-22 NOTE — ED Provider Notes (Signed)
[facility] EMERGENCY DEPARTMENT Provider Note   CSN: [id number] Arrival date & time: 12/22/19  1153     History Chief Complaint  Patient presents with  . Fall    Wayne Davies is a 29 y.o. male.  Patient is a 29 year old male presenting with complaints of fall.  He states he was walking down a dark flight of stairs yesterday evening when he tripped and fell.  He landed on his shoulder which has been surgically repaired in the past.  He is complaining of pain in this area.  He also reports hitting his head, but did not lose consciousness.  He has had a mild headache off and on since last night, but denies any nausea, vomiting, or visual disturbances.  The history is provided by the patient.  Fall This is a new problem. Episode onset: Last night. The problem occurs constantly. The problem has not changed since onset.Exacerbated by: Movement and palpation. Nothing relieves the symptoms.       Past Medical History:  Diagnosis Date  . Anxiety   . H/O gastroesophageal reflux (GERD)   . Migraine     There are no problems to display for this patient.   Past Surgical History:  Procedure Laterality Date  . FRACTURE SURGERY    . left shoulder    . ROTATOR CUFF REPAIR         No family history on file.  Social History   Tobacco Use  . Smoking status: Never Smoker  . Smokeless tobacco: Never Used  Substance Use Topics  . Alcohol use: No  . Drug use: Yes    Types: Marijuana    Home Medications Prior to Admission medications   Medication Sig Start Date End Date Taking? Authorizing Provider  ibuprofen (ADVIL,MOTRIN) 200 MG tablet Take 400 mg by mouth every 6 (six) hours as needed for moderate pain.     [provider]  naproxen (NAPROSYN) 500 MG tablet Take 1 tablet (500 mg total) by mouth 2 (two) times daily. 09/13/17   Khatri, Hina, PA-C    Allergies    Apple juice and Prednisone  Review of Systems   Review of Systems  All other systems  reviewed and are negative.   Physical Exam Updated Vital Signs BP (!) 142/85 (BP Location: Right Arm)   Pulse 78   Temp 98.5 F (36.9 C) (Oral)   Resp 16   Ht 6\' 2"  (1.88 m)   Wt 114.8 kg   SpO2 98%   BMI 32.48 kg/m   Physical Exam Vitals and nursing note reviewed.  Constitutional:      General: He is not in acute distress.    Appearance: He is well-developed. He is not diaphoretic.  HENT:     Head: Normocephalic and atraumatic.  Eyes:     Extraocular Movements: Extraocular movements intact.     Pupils: Pupils are equal, round, and reactive to light.  Neck:     Comments: There is no cervical spine tenderness or step-off.  He has painless range of motion in all directions. Cardiovascular:     Rate and Rhythm: Normal rate and regular rhythm.     Heart sounds: No murmur. No friction rub.  Pulmonary:     Effort: Pulmonary effort is normal. No respiratory distress.     Breath sounds: Normal breath sounds. No wheezing or rales.  Abdominal:     General: Bowel sounds are normal. There is no distension.     Palpations: Abdomen is soft.  Tenderness: There is no abdominal tenderness.  Musculoskeletal:        General: Normal range of motion.     Cervical back: Normal range of motion and neck supple.     Comments: The left shoulder appears grossly normal without deformity or evidence for dislocation.  Ulnar and radial pulses are easily palpable and motor and sensation are intact throughout the entire hand.  He has pain with abduction and external rotation.  Skin:    General: Skin is warm and dry.  Neurological:     Mental Status: He is alert and oriented to person, place, and time.     Cranial Nerves: No cranial nerve deficit.     Coordination: Coordination normal.     ED Results / Procedures / Treatments   Labs (all labs ordered are listed, but only abnormal results are displayed) Labs Reviewed - No data to display  EKG None  Radiology DG Scapula Left  Result  Date: 12/22/2019 CLINICAL DATA:  Fall, pain EXAM: LEFT SHOULDER - 2+ VIEW; LEFT SCAPULA - 2+ VIEWS COMPARISON:  None. FINDINGS: There is no evidence of fracture or dislocation. There is subchondral cyst formation of the anterior superior glenoid. No significant osteophytosis. The acromioclavicular joint is intact. Soft tissues are unremarkable. IMPRESSION: 1.  No fracture or dislocation of the left shoulder or scapula. 2. Subchondral cyst formation of the anterior superior glenoid, likely related to prior trauma/chondral defect. Shoulder may be further evaluated by MRI on a nonemergent basis if desired. Electronically Signed   By: Lauralyn Primes M.D.   On: 12/22/2019 13:41   DG Shoulder Left  Result Date: 12/22/2019 CLINICAL DATA:  Fall, pain EXAM: LEFT SHOULDER - 2+ VIEW; LEFT SCAPULA - 2+ VIEWS COMPARISON:  None. FINDINGS: There is no evidence of fracture or dislocation. There is subchondral cyst formation of the anterior superior glenoid. No significant osteophytosis. The acromioclavicular joint is intact. Soft tissues are unremarkable. IMPRESSION: 1.  No fracture or dislocation of the left shoulder or scapula. 2. Subchondral cyst formation of the anterior superior glenoid, likely related to prior trauma/chondral defect. Shoulder may be further evaluated by MRI on a nonemergent basis if desired. Electronically Signed   By: Lauralyn Primes M.D.   On: 12/22/2019 13:41    Procedures Procedures (including critical care time)  Medications Ordered in ED Medications - No data to display  ED Course  I have reviewed the triage vital signs and the nursing notes.  Pertinent labs & imaging results that were available during my care of the patient were reviewed by me and considered in my medical decision making (see chart for details).    MDM Rules/Calculators/A&P  Patient presenting with complaints of left shoulder pain after a fall yesterday evening.  He has history of surgery on the same shoulder.  X-rays are  negative and there is no obvious deformity.  The arm is neurovascularly intact.  Patient will be placed in a sling and treated with NSAIDs.  He is to follow-up with orthopedics if not improving.  Final Clinical Impression(s) / ED Diagnoses Final diagnoses:  None    Rx / DC Orders ED Discharge Orders    None       Geoffery Lyons, MD 12/22/19 [phone]

## 2019-12-22 NOTE — ED Notes (Signed)
Pharmacy and medications updated with patient 

## 2019-12-22 NOTE — ED Triage Notes (Signed)
Pt states he fell last night-c/o pain to left shoulder, shoulder blade, and neck-states he also hit back of head-no LOC-reports +nausea,dizziness, "confusion" -NAD-steady gait

## 2019-12-22 NOTE — Discharge Instructions (Signed)
Wear arm sling as applied for the next several days, then slowly begin to reintroduce activity.  Take naproxen as prescribed.  Take tramadol as prescribed as needed for pain not relieved with naproxen.  If your symptoms or not improving in the next week, follow-up with orthopedics.  The contact information for Dr. Magnus Ivan has been provided in this discharge summary for you to call and make these arrangements.

## 2020-09-19 ENCOUNTER — Emergency Department (HOSPITAL_COMMUNITY)
Admission: EM | Admit: 2020-09-19 | Discharge: 2020-09-19 | Disposition: A | Discharge status: Home / Self Care | Payer: 59 | Attending: Emergency Medicine | Admitting: Emergency Medicine

## 2020-09-19 ENCOUNTER — Encounter (HOSPITAL_COMMUNITY): Payer: Self-pay | Admitting: Emergency Medicine

## 2020-09-19 ENCOUNTER — Other Ambulatory Visit: Payer: Self-pay

## 2020-09-19 ENCOUNTER — Emergency Department (HOSPITAL_COMMUNITY): Payer: 59

## 2020-09-19 DIAGNOSIS — Z5321 Procedure and treatment not carried out due to patient leaving prior to being seen by health care provider: Secondary | ICD-10-CM | POA: Insufficient documentation

## 2020-09-19 DIAGNOSIS — M79602 Pain in left arm: Secondary | ICD-10-CM | POA: Diagnosis not present

## 2020-09-19 DIAGNOSIS — F419 Anxiety disorder, unspecified: Secondary | ICD-10-CM | POA: Diagnosis not present

## 2020-09-19 DIAGNOSIS — R0602 Shortness of breath: Secondary | ICD-10-CM | POA: Diagnosis not present

## 2020-09-19 DIAGNOSIS — M79601 Pain in right arm: Secondary | ICD-10-CM | POA: Insufficient documentation

## 2020-09-19 DIAGNOSIS — M545 Low back pain, unspecified: Secondary | ICD-10-CM | POA: Insufficient documentation

## 2020-09-19 DIAGNOSIS — M79604 Pain in right leg: Secondary | ICD-10-CM | POA: Diagnosis not present

## 2020-09-19 DIAGNOSIS — M79605 Pain in left leg: Secondary | ICD-10-CM | POA: Diagnosis not present

## 2020-09-19 LAB — CBC WITH DIFFERENTIAL/PLATELET
Abs Immature Granulocytes: 0.01 10*3/uL (ref 0.00–0.07)
Basophils Absolute: 0.1 10*3/uL (ref 0.0–0.1)
Basophils Relative: 1 %
Eosinophils Absolute: 0.4 10*3/uL (ref 0.0–0.5)
Eosinophils Relative: 7 %
HCT: 41.2 % (ref 39.0–52.0)
Hemoglobin: 13.7 g/dL (ref 13.0–17.0)
Immature Granulocytes: 0 %
Lymphocytes Relative: 43 %
Lymphs Abs: 2.2 10*3/uL (ref 0.7–4.0)
MCH: 27.7 pg (ref 26.0–34.0)
MCHC: 33.3 g/dL (ref 30.0–36.0)
MCV: 83.4 fL (ref 80.0–100.0)
Monocytes Absolute: 0.4 10*3/uL (ref 0.1–1.0)
Monocytes Relative: 7 %
Neutro Abs: 2.2 10*3/uL (ref 1.7–7.7)
Neutrophils Relative %: 42 %
Platelets: 390 10*3/uL (ref 150–400)
RBC: 4.94 MIL/uL (ref 4.22–5.81)
RDW: 13.4 % (ref 11.5–15.5)
WBC: 5.3 10*3/uL (ref 4.0–10.5)
nRBC: 0 % (ref 0.0–0.2)

## 2020-09-19 LAB — BASIC METABOLIC PANEL
Anion gap: 9 (ref 5–15)
BUN: 9 mg/dL (ref 6–20)
CO2: 20 mmol/L — ABNORMAL LOW (ref 22–32)
Calcium: 8.9 mg/dL (ref 8.9–10.3)
Chloride: 106 mmol/L (ref 98–111)
Creatinine, Ser: 1.2 mg/dL (ref 0.61–1.24)
GFR, Estimated: >60 mL/min (ref >60)
Glucose, Bld: 119 mg/dL — ABNORMAL HIGH (ref 70–99)
Potassium: 3.8 mmol/L (ref 3.5–5.1)
Sodium: 135 mmol/L (ref 135–145)

## 2020-09-19 NOTE — ED Triage Notes (Addendum)
Patient reports pain/burning sensation at arms/legs this evening , mild SOB , anxiety, and low back pain . Denies injury,respirations unlabored.

## 2020-09-19 NOTE — ED Notes (Signed)
Pt left AMA °

## 2021-07-14 IMAGING — CR DG CHEST 2V
2 series · 2 of 2 positions shown · non-contrast
Comparison: Radiograph 02/07/2011

CLINICAL DATA: Shortness of breath, anxiety

EXAM:
CHEST - 2 VIEW

[chest pa]
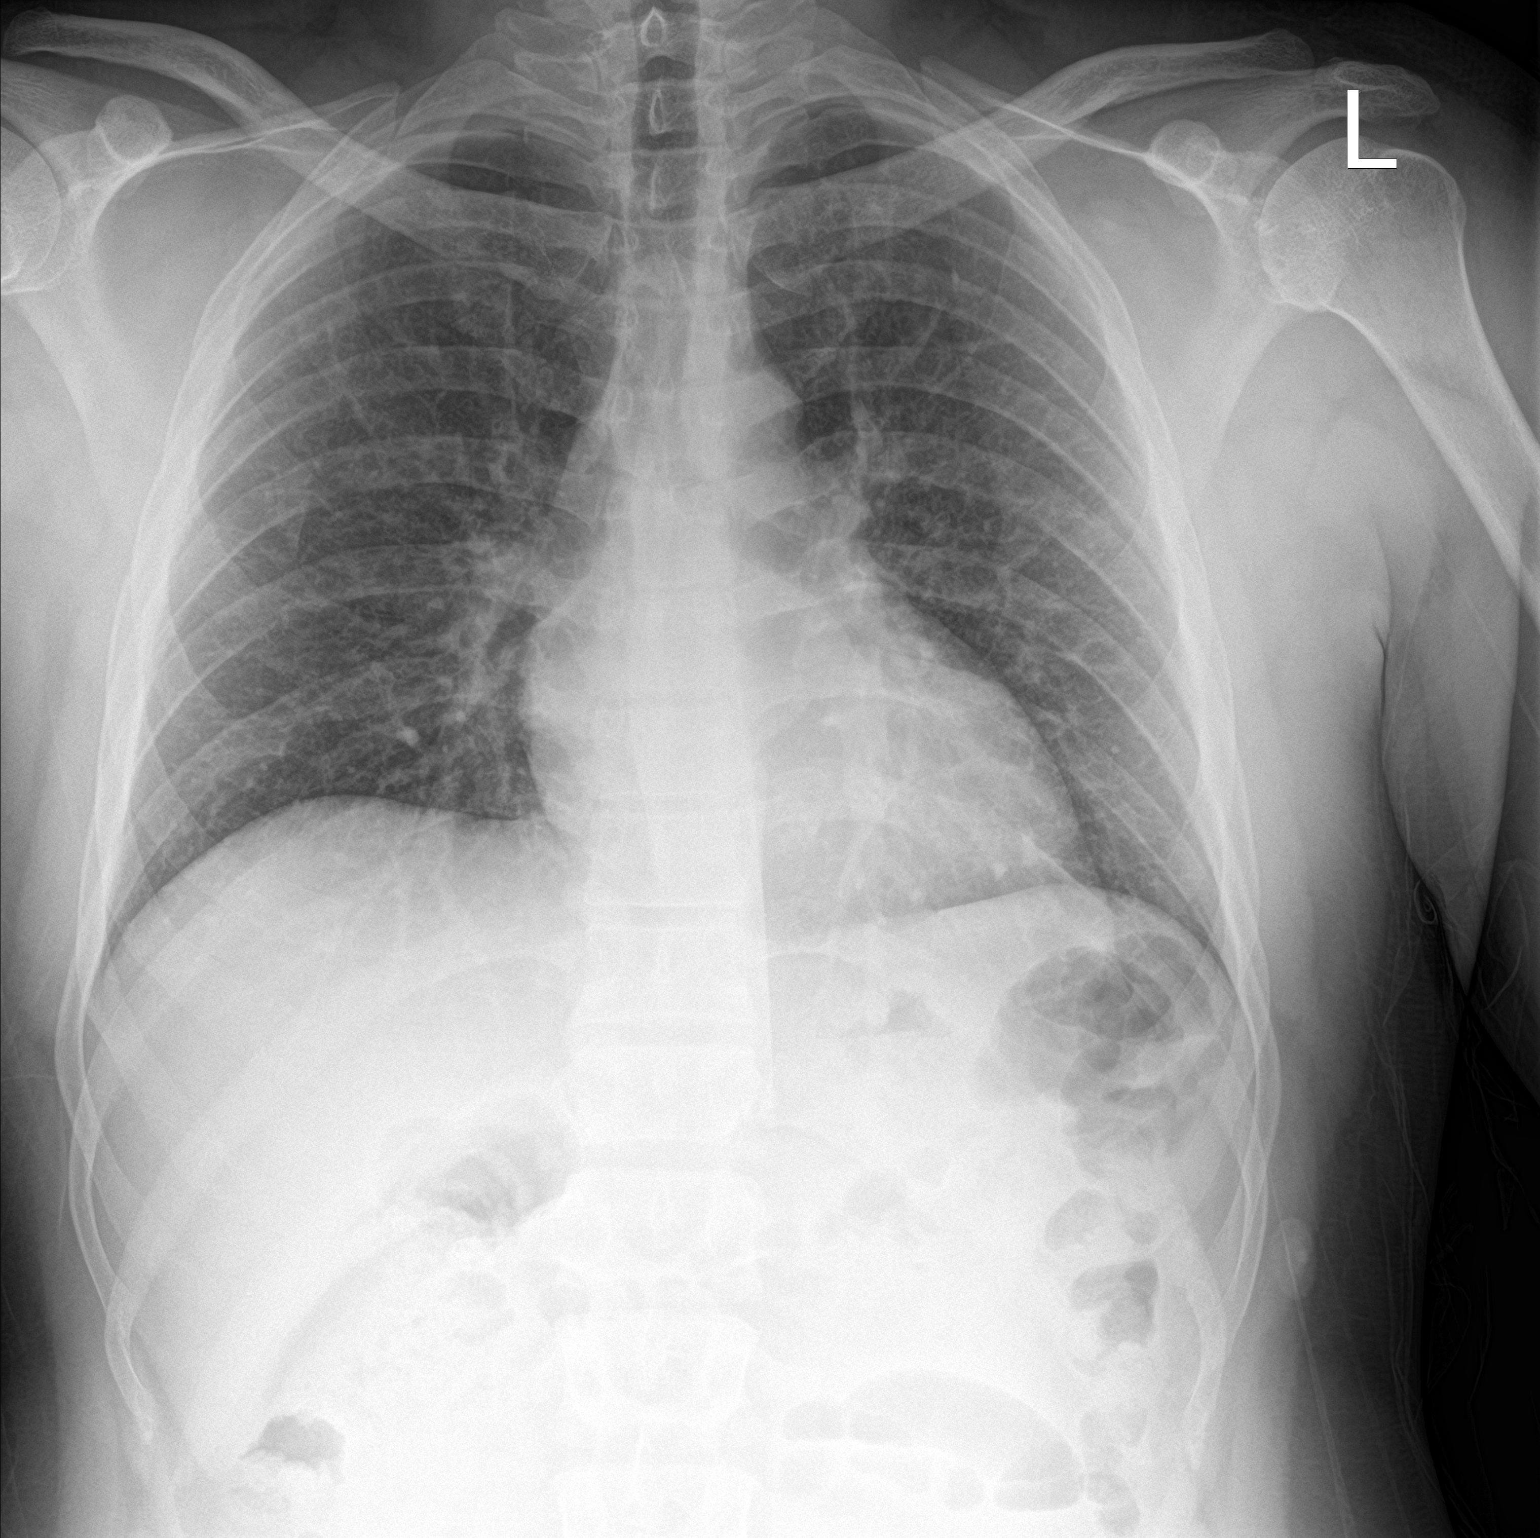

[chest lat]
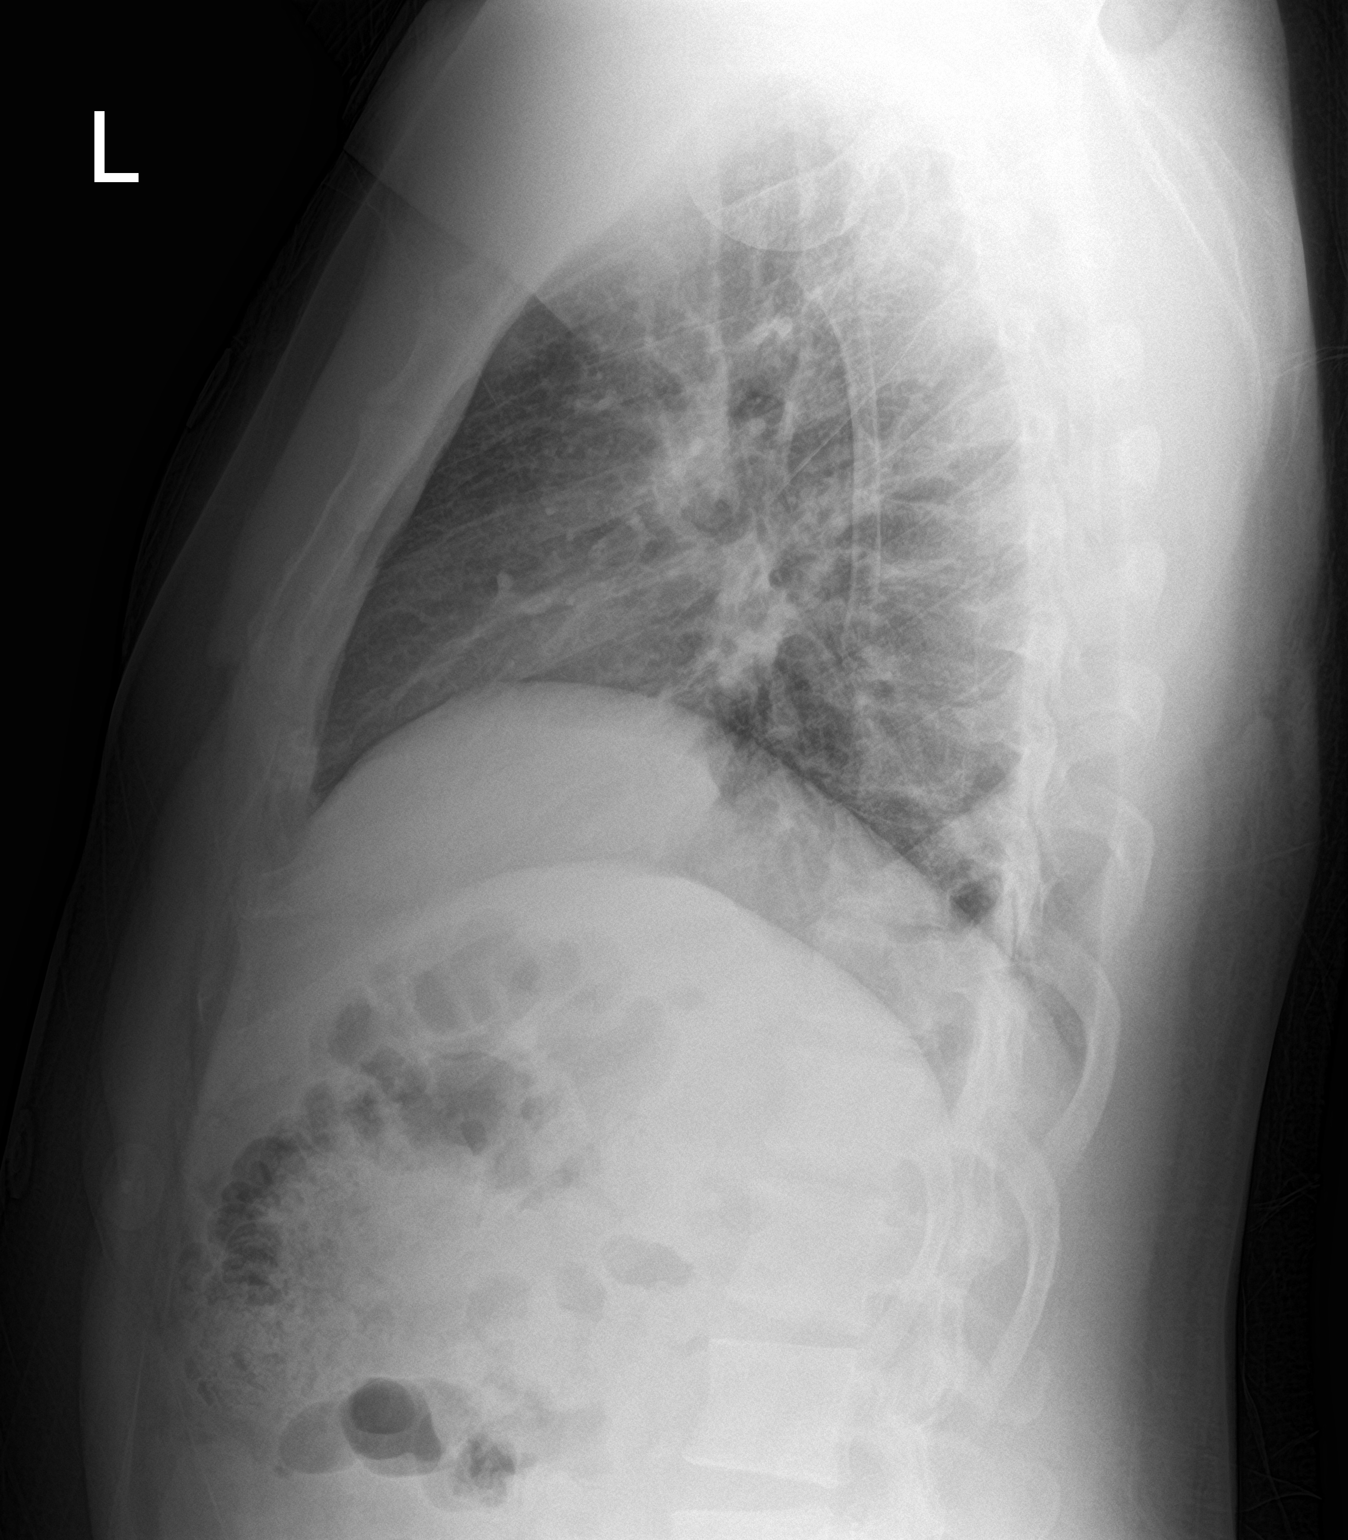

[2 of 2 positions shown; findings below may reference images not displayed]

FINDINGS: Some streaky opacities likely reflect atelectatic change with low
volumes. No consolidative process, convincing edema, pneumothorax or
effusion. The cardiomediastinal contours are unremarkable. No acute
osseous or soft tissue abnormality.
IMPRESSION: Streaky opacities likely reflect atelectasis with low volumes.
